# Patient Record
Sex: Male | Born: 2000 | Race: White | Hispanic: No | Marital: Single | Smoking: Never smoker
Health system: Southern US, Community
[De-identification: ages and names within clinical notes are randomized; demographics above are authoritative.]

## PROBLEM LIST (undated history)

## (undated) DIAGNOSIS — T7840XA Allergy, unspecified, initial encounter: Secondary | ICD-10-CM

## (undated) HISTORY — DX: Allergy, unspecified, initial encounter: T78.40XA

---
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
## 2022-01-14 ENCOUNTER — Telehealth: Payer: Self-pay | Admitting: Neurology

## 2022-01-14 ENCOUNTER — Encounter: Payer: Self-pay | Admitting: Neurology

## 2022-01-14 ENCOUNTER — Ambulatory Visit: Payer: Managed Care, Other (non HMO) | Admitting: Neurology

## 2022-01-14 VITALS — BP 140/70 | HR 77 | Ht 73.0 in | Wt 279.6 lb

## 2022-01-14 DIAGNOSIS — G40909 Epilepsy, unspecified, not intractable, without status epilepticus: Secondary | ICD-10-CM

## 2022-01-14 DIAGNOSIS — Z5181 Encounter for therapeutic drug level monitoring: Secondary | ICD-10-CM | POA: Diagnosis not present

## 2022-01-14 MED ORDER — LEVETIRACETAM 500 MG PO TABS: 500.0000 mg | ORAL_TABLET | Freq: Two times a day (BID) | ORAL | 4 refills | Status: DC

## 2022-01-14 NOTE — Progress Notes (Signed)
? ?[facility] ? ?PATIENT: Dalton Stark ?DOB: 05/29/2001 ? ?REQUESTING CLINICIAN: [facility] ?HISTORY FROM: Patient and both parents  ?REASON FOR VISIT: New onset seizure ? ? ?HISTORICAL ? ?CHIEF COMPLAINT:  ?Chief Complaint  ?Patient presents with  ? Hospitalization Follow-up  ?  Rm 17. Accompanied by mother and father. ?NP ED referral for seizures.  ? ? ?HISTORY OF PRESENT ILLNESS:  ?This is a 21 year old gentleman with past reported medical history of ADD who is presenting after his first lifetime seizure on April 29.  Patient states that on that day he reported going to do his SAT pretest, come home was on the couch eating and next and that he remembers is paramedics on top of him.  Then the next thing is sitting on the chair and the next thing is waking up in the hospital.  Per father who witnessed the event, patient was sitting on the couch eating then suddenly he had his eyes and head rolled back and initially he is was very stiff, then he helped him to the floor and he was unresponsive for about a minute or 2.  There was tongue biting but no urinary incontinence.  By the time EMS arrived seizure ended but patient was very confused and combative requiring Versed.  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  He was also started on Keppra 500 mg twice daily.  Since being on Keppra, he denies any seizure or seizure-like activity, report compliance with the medication, denies any side effects.  No previous seizure in the past and no report of seizure risk factors ?Mother also reported the patient was up all night studying for his SAT test. ? ? ?Handedness: Right handed ? ?Onset:12/27/21 ? ?Seizure Type: Generalized convulsion  ? ?Current frequency: Only once  ? ?Any injuries from seizures: Tongue biting  ? ?Seizure risk factors: None reported  ? ?Previous ASMs:  None ? ?Currenty ASMs: Levetiracetam 500 mg twice daily ? ?ASMs side effects: Denies ? 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 ?Previous EEGs: None ? ? ?OTHER MEDICAL CONDITIONS: ADD ? ?REVIEW OF SYSTEMS: Full 14 system review of systems performed and negative with exception of: as noted in the HPI. ? ?ALLERGIES: ?No Known Allergies ? ?HOME MEDICATIONS: ?Outpatient Medications Prior to Visit  ?Medication Sig Dispense Refill  ? levETIRAcetam (KEPPRA) 500 MG tablet Take 1 tablet (500 mg total) by mouth 2 (two) times daily. 60 tablet 0  ? ?No facility-administered medications prior to visit.  ? ? ?PAST MEDICAL HISTORY: ?Past Medical History:  ?Diagnosis Date  ? Allergy   ? ? ?PAST SURGICAL HISTORY: ?History reviewed. No pertinent surgical history. ? ?FAMILY HISTORY: ?Family History  ?Problem Relation Age of Onset  ? Diabetes Father   ? Hyperlipidemia Father   ? Asthma Maternal Grandmother   ? Hyperlipidemia Maternal Grandmother   ? ? ?SOCIAL HISTORY: ?Social History  ? ?Socioeconomic History  ? Marital status: Single  ?  Spouse name: Not on file  ? Number of children: Not on file  ? Years of education: Not on file  ? Highest education level: Not on file  ?Occupational History  ? Not on file  ?Tobacco Use  ? Smoking status: Never  ? Smokeless tobacco: Never  ?Substance and Sexual Activity  ? Alcohol use: No  ? Drug use: No  ? Sexual activity: Not on file  ?  Other Topics Concern  ? Not on file  ?Social History Narrative  ? Not on file  ? ?Social Determinants of Health  ? ?Financial Resource Strain: Not on file  ?Food Insecurity: Not on file  ?Transportation Needs: Not on file  ?Physical Activity: Not on file  ?Stress: Not on file  ?Social Connections: Not on file  ?Intimate Partner Violence: Not on file  ? ? ?PHYSICAL EXAM ? ?GENERAL EXAM/CONSTITUTIONAL: ?Vitals:  ?Vitals:  ? 01/14/22 1148  ?BP: 140/70  ?Pulse: 77  ?Weight: 279 lb 9.6 oz (126.8 kg)  ?Height: 6\' 1"  (1.854 m)  ? ?Body mass index is 36.89  kg/m?. ?Wt Readings from Last 3 Encounters:  ?01/14/22 279 lb 9.6 oz (126.8 kg) (>99 %, Z= 2.82)*  ?12/27/21 250 lb (113.4 kg) (>99 %, Z= 2.44)*  ?12/27/18 210 lb (95.3 kg) (99 %, Z= 2.31)*  ? ?* Growth percentiles are based on CDC (Boys, 2-20 Years) data.  ? ?Patient is in no distress; well developed, nourished and groomed; neck is supple ? ?EYES: ?Pupils round and reactive to light, Visual fields full to confrontation, Extraocular movements intacts,  ?No results found. ? ?MUSCULOSKELETAL: ?Gait, strength, tone, movements noted in Neurologic exam below ? ?NEUROLOGIC: ?MENTAL STATUS:  ?   ? View : No data to display.  ?  ?  ?  ? ?awake, alert, oriented to person, place and time ?recent and remote memory intact ?normal attention and concentration ?language fluent, comprehension intact, naming intact ?fund of knowledge appropriate ? ?CRANIAL NERVE:  ?2nd, 3rd, 4th, 6th - pupils equal and reactive to light, visual fields full to confrontation, extraocular muscles intact, no nystagmus ?5th - facial sensation symmetric ?7th - facial strength symmetric ?8th - hearing intact ?9th - palate elevates symmetrically, uvula midline ?11th - shoulder shrug symmetric ?12th - tongue protrusion midline ? ?MOTOR:  ?normal bulk and tone, full strength in the BUE, BLE ? ?SENSORY:  ?normal and symmetric to light touch, pinprick, temperature, vibration ? ?COORDINATION:  ?finger-nose-finger, fine finger movements normal ? ?REFLEXES:  ?deep tendon reflexes present and symmetric ? ?GAIT/STATION:  ?normal ? ? ?DIAGNOSTIC DATA (LABS, IMAGING, TESTING) ?- I reviewed patient records, labs, notes, testing and imaging myself where available. ? ?Lab Results  ?Component Value Date  ? WBC 10.7 (H) 12/27/2021  ? HGB 16.8 12/27/2021  ? HCT 49.5 12/27/2021  ? MCV 85.5 12/27/2021  ? PLT 306 12/27/2021  ? ?   ?Component Value Date/Time  ? NA 139 12/27/2021 1545  ? K 4.0 12/27/2021 1545  ? CL 105 12/27/2021 1545  ? CO2 20 (L) 12/27/2021 1545  ? GLUCOSE 131  (H) 12/27/2021 1545  ? BUN 9 12/27/2021 1545  ? CREATININE 1.05 12/27/2021 1545  ? CALCIUM 9.9 12/27/2021 1545  ? GFRNONAA >60 12/27/2021 1545  ? ?No results found for: CHOL, HDL, LDLCALC, LDLDIRECT, TRIG ?No results found for: HGBA1C ?No results found for: VITAMINB12 ?No results found for: TSH ? ? ?MRI Brain without contrast 12/27/2021 ?1. 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 ?2. No acute intracranial process ? ?I personally reviewed brain Images. ? ? ?ASSESSMENT AND PLAN ? ?21 y.o. year old male  with ADD who is presenting after his first lifetime seizure in the setting of sleep deprivation.  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  I will start  by obtaining a MRI brain with and without contrast for better assessment of this finding, obtain a routine EEG and Keppra level.  I will continue the patient on Keppra 500 mg twice daily and adjust as needed based on the Keppra level.  I informed parents that this is his first lifetime seizure and depending on what the EEG and MRI show will determine how long he will be on the medication.  They are comfortable with plans, all other questions answered.  I will contact him to go over the result of the MRI and EEG and I will see him in 3 months for follow-up. ? ? ?1. Seizure disorder (HCC)   ?2. Therapeutic drug monitoring   ? ? 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 ? ?Per [facility] statutes, patients with seizures are not allowed to drive until they have been seizure-free for six months.  Other recommendations include using caution when using heavy equipment  or power tools. Avoid working on ladders or at heights. Take showers instead of baths.  Do not swim alone.  Ensure the water temperature is not too high on the home water heater. Do not go swimming alone. Do not lock your

## 2022-01-14 NOTE — Telephone Encounter (Signed)
[employer] sent to GI they obtain auth and call patient to schedule 

## 2022-01-14 NOTE — Patient Instructions (Addendum)
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

## 2022-01-15 LAB — LEVETIRACETAM LEVEL: Levetiracetam Lvl: 10.5 ug/mL (ref 10.0–40.0)

## 2022-01-22 ENCOUNTER — Telehealth: Payer: Self-pay | Admitting: Neurology

## 2022-01-22 ENCOUNTER — Ambulatory Visit: Payer: Managed Care, Other (non HMO) | Admitting: Neurology

## 2022-01-22 ENCOUNTER — Telehealth: Payer: Self-pay | Admitting: *Deleted

## 2022-01-22 DIAGNOSIS — G40909 Epilepsy, unspecified, not intractable, without status epilepticus: Secondary | ICD-10-CM

## 2022-01-22 NOTE — Telephone Encounter (Signed)
I spoke to his mother and provided the EEG results.

## 2022-01-22 NOTE — Telephone Encounter (Signed)
Thanks. Noted.

## 2022-01-22 NOTE — Telephone Encounter (Signed)
For EEG and MRI results please call patients mother Babette Relic [phone]

## 2022-01-22 NOTE — Telephone Encounter (Signed)
-----   Message from Windell Norfolk, MD sent at 01/22/2022  4:22 PM EDT ----- Please call patients mother Babette Relic [phone] and inform her that [facility] EEG (Brain wave test) was normal. In particular, there were no epileptiform discharges and no seizures. No further action is required on this test at this time. Please keep any upcoming appointments or tests and  call us with any interim questions, concerns, problems or updates. Thanks,   Windell Norfolk, MD

## 2022-01-22 NOTE — Procedures (Signed)
    History:  21 year old man with seizure  EEG classification: Awake and drowsy  Description of the recording: The background rhythms of this recording consists of a fairly well modulated medium amplitude alpha rhythm of 9 Hz that is reactive to eye opening and closure. As the record progresses, the patient appears to remain in the waking state throughout the recording. Photic stimulation was performed, did not show any abnormalities. Hyperventilation was also performed, did not show any abnormalities. Toward the end of the recording, the patient enters the drowsy state with slight symmetric slowing seen. The patient never enters stage II sleep. No abnormal epileptiform discharges seen during this recording. There was no focal slowing. EKG monitor shows no evidence of cardiac rhythm abnormalities with a heart rate of 72.  Abnormality: None   Impression: This is a normal EEG recording in the waking and drowsy state. No evidence of interictal epileptiform discharges seen. A normal EEG does not exclude a diagnosis of epilepsy.    Windell Norfolk, MD [facility]

## 2022-01-25 ENCOUNTER — Ambulatory Visit
Admission: RE | Admit: 2022-01-25 | Discharge: 2022-01-25 | Disposition: A | Discharge status: Home / Self Care | Payer: Managed Care, Other (non HMO) | Source: Ambulatory Visit | Attending: Neurology | Admitting: Neurology

## 2022-01-25 DIAGNOSIS — G40909 Epilepsy, unspecified, not intractable, without status epilepticus: Secondary | ICD-10-CM

## 2022-01-25 MED ORDER — GADOBENATE DIMEGLUMINE 529 MG/ML IV SOLN
20.0000 mL | Freq: Once | INTRAVENOUS | Status: AC | PRN
  Administered 2022-01-25: 20 mL via INTRAVENOUS

## 2022-01-29 ENCOUNTER — Encounter: Payer: Self-pay | Admitting: Neurology

## 2022-04-16 ENCOUNTER — Ambulatory Visit: Payer: Managed Care, Other (non HMO) | Admitting: Neurology

## 2022-04-16 ENCOUNTER — Encounter: Payer: Self-pay | Admitting: Neurology

## 2022-04-16 VITALS — BP 128/80 | HR 80 | Ht 73.0 in | Wt 280.0 lb

## 2022-04-16 DIAGNOSIS — G40909 Epilepsy, unspecified, not intractable, without status epilepticus: Secondary | ICD-10-CM

## 2022-04-16 NOTE — Progress Notes (Addendum)
[facility]  PATIENT: Dalton Stark DOB: March 28, 2001  REQUESTING CLINICIAN: No ref. provider found HISTORY FROM: Patient and both parents  REASON FOR VISIT: New onset seizure   HISTORICAL  CHIEF COMPLAINT:  Chief Complaint  Patient presents with   Follow-up    Rm 13, with parents  States he is doing well, reports no sz   INTERVAL HISTORY 04/16/2022:  Patient presented today for follow-up with parents.  Since last visit denies any seizure or seizure-like activity.  He is compliant with the Keppra 500 mg twice daily and denies any side effect from the medication.  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  No other complaint or concerns.   HISTORY OF PRESENT ILLNESS:  This is a 21 year old gentleman with past reported medical history of ADD who is presenting after his first lifetime seizure on April 29.  Patient states that on that day he reported going to do his SAT pretest, come home was on the couch eating and next and that he remembers is paramedics on top of him.  Then the next thing is sitting on the chair and the next thing is waking up in the hospital.  Per father who witnessed the event, patient was sitting on the couch eating then suddenly he had his eyes and head rolled back and initially he is was very stiff, then he helped him to the floor and he was unresponsive for about a minute or 2.  There was tongue biting but no urinary incontinence.  By the time EMS arrived seizure ended but patient was very confused and combative requiring Versed.  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  He was also started on Keppra 500 mg twice daily.  Since being on Keppra, he denies any seizure or seizure-like activity, report compliance with the medication, denies any side effects.  No previous seizure in the past and no report of seizure  risk factors Mother also reported the patient was up all night studying for his SAT test.   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  ALLERGIES: No Known Allergies  HOME MEDICATIONS: Outpatient Medications Prior to Visit  Medication Sig Dispense Refill   levETIRAcetam (KEPPRA) 500 MG tablet Take 1 tablet (500 mg total) by mouth 2 (two) times daily. 180 tablet 4   No facility-administered medications prior to visit.    PAST MEDICAL HISTORY: Past Medical History:  Diagnosis Date   Allergy     PAST SURGICAL HISTORY: History reviewed. No pertinent surgical history.  FAMILY HISTORY: Family History  Problem Relation Age of Onset   Diabetes Father    Hyperlipidemia Father    Asthma Maternal Grandmother    Hyperlipidemia Maternal Grandmother     SOCIAL HISTORY: Social History   Socioeconomic History   Marital status: Single    Spouse name: Not on file   Number of children: Not on file  Years of education: Not on file   Highest education level: Not on file  Occupational History   Not on file  Tobacco Use   Smoking status: Never   Smokeless tobacco: Never  Substance and Sexual Activity   Alcohol use: No   Drug use: No   Sexual activity: Not on file  Other Topics Concern   Not on file  Social History Narrative   Not on file   Social Determinants of Health   Financial Resource Strain: Not on file  Food Insecurity: Not on file  Transportation Needs: Not on file  Physical Activity: Not  on file  Stress: Not on file  Social Connections: Not on file  Intimate Partner Violence: Not on file    PHYSICAL EXAM  GENERAL EXAM/CONSTITUTIONAL: Vitals:  Vitals:   04/16/22 1156  BP: 128/80  Pulse: 80  Weight: 280 lb (127 kg)  Height: 6\' 1"  (1.854 m)   Body mass index is 36.94 kg/m. Wt Readings from Last 3 Encounters:  04/16/22 280 lb (127 kg) (>99%, Z= 2.82)*  01/14/22 279 lb 9.6 oz (126.8 kg) (>99%, Z= 2.82)*  12/27/21 250 lb (113.4 kg) (>99%, Z= 2.44)*   * Growth percentiles are based on CDC (Boys, 2-20 Years) data.   Patient is in no distress; well developed, nourished and groomed; neck is supple  EYES: Pupils round and reactive to light, Visual fields full to confrontation, Extraocular movements intacts,  No results found.  MUSCULOSKELETAL: Gait, strength, tone, movements noted in Neurologic exam below  NEUROLOGIC: MENTAL STATUS:      No data to display         awake, alert, oriented to person, place and time recent and remote memory intact normal attention and concentration language fluent, comprehension intact, naming intact fund of knowledge appropriate  CRANIAL NERVE:  2nd, 3rd, 4th, 6th - pupils equal and reactive to light, visual fields full to confrontation, extraocular muscles intact, no nystagmus 5th - facial sensation symmetric 7th - facial strength symmetric 8th - hearing intact 9th - palate elevates symmetrically, uvula midline 11th - shoulder shrug symmetric 12th - tongue protrusion midline  MOTOR:  normal bulk and tone, full strength in the BUE, BLE  SENSORY:  normal and symmetric to light touch, pinprick, temperature, vibration  COORDINATION:  finger-nose-finger, fine finger movements normal  REFLEXES:  deep tendon reflexes present and symmetric  GAIT/STATION:  normal   DIAGNOSTIC DATA (LABS, IMAGING, TESTING) - I reviewed patient records, labs, notes, testing and imaging myself where available.  Lab Results   Component Value Date   WBC 10.7 (H) 12/27/2021   HGB 16.8 12/27/2021   HCT 49.5 12/27/2021   MCV 85.5 12/27/2021   PLT 306 12/27/2021      Component Value Date/Time   NA 139 12/27/2021 1545   K 4.0 12/27/2021 1545   CL 105 12/27/2021 1545   CO2 20 (L) 12/27/2021 1545   GLUCOSE 131 (H) 12/27/2021 1545   BUN 9 12/27/2021 1545   CREATININE 1.05 12/27/2021 1545   CALCIUM 9.9 12/27/2021 1545   GFRNONAA >60 12/27/2021 1545   No results found for: "CHOL", "HDL", "LDLCALC", "LDLDIRECT", "TRIG" No results found for: "HGBA1C" No results found for: "VITAMINB12" No results found for: "TSH"   MRI Brain without contrast 12/27/2021 1. 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 2. No acute intracranial process  MRI Brain with and without contrast 01/25/2022 1.There is  a 3 mm focus with signal intensity equal to gray matter on all sequences in the genu of the corpus callosum anterior to the frontal horn of the left lateral ventricle.  It does not enhance.  It could represent a gray matter heterotopia. 2. 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 Normal enhancement pattern.  No acute findings.   I personally reviewed brain Images.   ASSESSMENT AND PLAN  21 y.o. year old male  with ADD who is presenting for follow up after his first lifetime seizure in the setting of sleep deprivation in April.  He is doing well on Keppra 500 mg twice daily, denies any additional seizures.  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  This heterotropia is at least as likely as not contributing to the patient's seizure but cannot be absolute.  I will proceed with an ambulatory EEG for further evaluation.  Patient will be going to college but he will contact us when he gets a break from school and at that time, we will complete the EEG.  The rationale behind the  EEG is to capture abnormal discharges in the left frontal region.  I will see him in 1 year for follow-up; if he continues to do better at that time we will most likely discontinue the Keppra.  Patient and family are comfortable with plans.   1. Seizure disorder [facility])     Patient Instructions  Continue with Keppra 500 mg twice daily Patient to contact us when he is ready for ambulatory EEG Follow-up in a year or sooner if worse.   Per [facility] statutes, patients with seizures are not allowed to drive until they have been seizure-free for six months.  Other recommendations include using caution when using heavy equipment or power tools. Avoid working on ladders or at heights. Take showers instead of baths.  Do not swim alone.  Ensure the water temperature is not too high on the home water heater. Do not go swimming alone. Do not lock yourself in a room alone (i.e. bathroom). When caring for infants or small children, sit down when holding, feeding, or changing them to minimize risk of injury to the child in the event you have a seizure. Maintain good sleep hygiene. Avoid alcohol.  Also recommend adequate sleep, hydration, good diet and minimize stress.   During the Seizure  - First, ensure adequate ventilation and place patients on the floor on their left side  Loosen clothing around the neck and ensure the airway is patent. If the patient is clenching the teeth, do not force the mouth open with any object as this can cause severe damage - Remove all items from the surrounding that can be hazardous. The patient may be oblivious to what's happening and may not even know what he or she is doing. If the patient is confused and wandering, either gently guide him/her away and block access to outside areas - Reassure the individual and be comforting - Call 911. In most cases, the seizure ends before EMS arrives. However, there are cases when seizures may last over 3 to 5 minutes. Or the  individual may have developed breathing difficulties or severe injuries. If a pregnant patient or a person with diabetes develops a seizure, it is prudent to call an ambulance. - Finally, if the patient does not regain full consciousness, then call EMS. Most patients will remain confused for about 45 to 90 minutes  after a seizure, so you must use judgment in calling for help. - Avoid restraints but make sure the patient is in a bed with padded side rails - Place the individual in a lateral position with the neck slightly flexed; this will help the saliva drain from the mouth and prevent the tongue from falling backward - Remove all nearby furniture and other hazards from the area - Provide verbal assurance as the individual is regaining consciousness - Provide the patient with privacy if possible - Call for help and start treatment as ordered by the caregiver   After the Seizure (Postictal Stage)  After a seizure, most patients experience confusion, fatigue, muscle pain and/or a headache. Thus, one should permit the individual to sleep. For the next few days, reassurance is essential. Being calm and helping reorient the person is also of importance.  Most seizures are painless and end spontaneously. Seizures are not harmful to others but can lead to complications such as stress on the lungs, brain and the heart. Individuals with prior lung problems may develop labored breathing and respiratory distress.     No orders of the defined types were placed in this encounter.   No orders of the defined types were placed in this encounter.   Return in about 1 year (around 04/17/2023).   I have spent a total of 40 minutes dedicated to this patient today, preparing to see patient, performing a medically appropriate examination and evaluation, ordering tests and/or medications and procedures, and counseling and educating the patient/family/caregiver; independently interpreting result and communicating  results to the family/patient/caregiver; and documenting clinical information in the electronic medical record.    Windell Norfolk, MD 05/11/2023, 7:59 AM  [facility] [address], [address] [place], [place] [address] [phone]

## 2022-04-16 NOTE — Patient Instructions (Addendum)
Continue with Keppra 500 mg twice daily Patient to contact us when he is ready for ambulatory EEG Follow-up in a year or sooner if worse.

## 2022-08-22 ENCOUNTER — Other Ambulatory Visit: Payer: Self-pay | Admitting: Neurology

## 2022-08-22 ENCOUNTER — Encounter: Payer: Self-pay | Admitting: Neurology

## 2022-08-22 DIAGNOSIS — G40909 Epilepsy, unspecified, not intractable, without status epilepticus: Secondary | ICD-10-CM

## 2022-08-22 NOTE — Progress Notes (Signed)
Please call patient/mother and inform them that the recent ambulatory EEG (Brain wave test) was abnormal. It showed generalized epileptiform discharges seen during sleep. This is consistent with generalized epilepsy. We did not see any abnormality in the left frontal region where the probable abnormality is located. Please advised them to continue with Keppra 500 mg twice daily for now. Follow up as scheduled.  Thanks,   Windell Norfolk, MD

## 2022-08-22 NOTE — Procedures (Signed)
Clinical History : This is a 21y/o M who presents with a single seizure on April 29 that consisted of eyes rolling back, becoming stiff followed by generalized convulsions and tongue biting, but no incontinence lasting a minute or two. Per mom he had been up all night studying for his SAT's. He has a past history of ADD. 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  INTERMITTENT MONITORING with VIDEO TECHNICAL SUMMARY: This AVEEG was performed using equipment provided by [employer] utilizing Bluetooth ( [employer] ) amplifiers with continuous EEGT attended video collection using encrypted remote transmission via [employer] secured cellular tower network with data rates for each AVEEG performed. This is a Therapist, music AVEEG, obtained, according to the 10-20 international electrode placement system, reformatted digitally into referential and bipolar montages. Data was acquired with a minimum of 21 bipolar connections and sampled at a minimum rate of 250 cycles per second per channel, maximum rate of 450 cycles per second per channel and two channels for EKG. The entire VEEG study was recorded through cable and or radio telemetry for subsequent analysis. Specified epochs of the AVEEG data were identified at the direction of the subject by the depression of a push button by the patient. Each patients event file included data acquired two minutes prior to the push button activation and continuing until two minutes afterwards. AVEEG files were reviewed on [employer] [facility] server, Licensed Software provided by [employer] with a digital high frequency filter set at 70 Hz and a low frequency filter set at 1 Hz with a paper speed of 22mm/s resulting in 10 seconds per digital page. This entire AVEEG was reviewed by the EEG Technologist. Random time samples, random sleep samples, clips, patient initiated push button files with included patient daily diary logs, EEG Technologist pruned data was reviewed  and verified for accuracy and validity by the governing reading neurologist in full details. This AEEGV was fully compliant with all requirements for CPT 97500 for setup, patient education, take down and administered by an EEG technologist. Long-Term EEG with Video was monitored intermittently by a qualified EEG technologist for the entirety of the recording; quality check-ins were performed at a minimum of every two hours, checking and documenting real-time data and video to assure the integrity and quality of the recording (e.g., camera position, electrode integrity and impedance), and identify the need for maintenance. For intermittent monitoring, an EEG Technologist monitored no more than 12 patients concurrently. Diagnostic video was captured at least 80% of the time during the recording.  PATIENT EVENTS: There were no patient events noted or captured during this recording.  TECHNOLOGIST EVENTS: Occasional generalized high amplitude poly sharps and spike and wave discharges during sleep up to 5Hz   TIME SAMPLES: 10-minutes of every 2 hours recorded are reviewed as random time samples.  SLEEP SAMPLES: 5-minutes of every 24 hours recorded are reviewed as random sleep samples.  AWAKE: At maximal level of alertness, the posterior dominant background activity was continuous, reactive, low voltage rhythm of 8.5-9 Hz. This was symmetric, well-modulated, and attenuated with eye opening. Diffuse, symmetric, frontocentral beta range activity was present.  SLEEP: N1 Sleep (Stage 1) was observed and characterized by the disappearance of alpha rhythm and the appearance of vertex activity. N2 Sleep (Stage 2) was observed and characterized by vertex waves, K-complexes, and sleep spindles. N3 (Stage 3) sleep was observed and characterized by high amplitude Delta activity of 20%. REM sleep was observed.  EKG: There were no arrhythmias or abnormalities noted during this  recording.   Impression: This  is an abnormal ambulatory video EEG due to presence of generalized epileptiform discharges (sharps and spike and slow waves) up to 5Hz . 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   Alric Ran, MD [facility]

## 2022-08-27 ENCOUNTER — Telehealth: Payer: Self-pay

## 2022-08-27 NOTE — Telephone Encounter (Signed)
Spoke to mother, results given, mother had several questions about EEG results and asked for call back from MD when return to office.

## 2022-08-27 NOTE — Telephone Encounter (Signed)
-----   Message from Windell Norfolk, MD sent at 08/22/2022  9:48 AM EST ----- Please call patient/mother and inform them that the recent ambulatory EEG (Brain wave test) was abnormal. It showed generalized epileptiform discharges seen during sleep. This is consistent with generalized epilepsy. We did not see any abnormality in the left frontal region where the probable abnormality is located. Please advised them to continue with Keppra 500 mg twice daily for now. Follow up as scheduled.  Thanks,   Windell Norfolk, MD

## 2022-09-07 NOTE — Telephone Encounter (Signed)
Spoke with mother, all questions answered. He is compliant with the Keppra 500 mg twice daily but sometimes will forget to take it. Denies any breakthrough seizures.

## 2022-10-20 ENCOUNTER — Encounter: Payer: Self-pay | Admitting: Neurology

## 2022-10-21 ENCOUNTER — Other Ambulatory Visit: Payer: Self-pay | Admitting: Neurology

## 2022-10-21 MED ORDER — LEVETIRACETAM ER 1000 MG PO TB24: 1000.0000 mg | ORAL_TABLET | Freq: Every day | ORAL | 3 refills | Status: DC

## 2022-10-21 NOTE — Progress Notes (Signed)
Will switch to extended due to side effects.

## 2022-11-05 ENCOUNTER — Other Ambulatory Visit (HOSPITAL_COMMUNITY): Payer: Self-pay

## 2022-11-05 NOTE — Telephone Encounter (Signed)
Generic is fine. Thanks

## 2022-11-17 NOTE — Telephone Encounter (Signed)
It seems like Keppra XR is out of stock. We can continue with IR 500 mg twice a day until the pharmacy has the XR version.

## 2022-11-23 ENCOUNTER — Other Ambulatory Visit: Payer: Self-pay

## 2022-11-23 DIAGNOSIS — G40909 Epilepsy, unspecified, not intractable, without status epilepticus: Secondary | ICD-10-CM

## 2022-11-23 MED ORDER — LEVETIRACETAM ER 1000 MG PO TB24: 1000.0000 mg | ORAL_TABLET | Freq: Every day | ORAL | 3 refills | Status: DC

## 2022-12-03 ENCOUNTER — Telehealth: Payer: Self-pay | Admitting: Neurology

## 2022-12-03 MED ORDER — LEVETIRACETAM ER 500 MG PO TB24: 1000.0000 mg | ORAL_TABLET | Freq: Every day | ORAL | 0 refills | Status: DC

## 2022-12-03 NOTE — Telephone Encounter (Signed)
Rx sent to pharmacy   

## 2022-12-03 NOTE — Telephone Encounter (Signed)
Pt mother called. Stated the pharmacy is out of stock levETIRAcetam 1,000 mg. She is asking if levETIRAcetam  500mg  for a 30day supply can be called in [employer]. She is requesting a call back

## 2022-12-05 ENCOUNTER — Other Ambulatory Visit: Payer: Self-pay | Admitting: Neurology

## 2022-12-05 MED ORDER — LEVETIRACETAM 500 MG PO TABS: 500.0000 mg | ORAL_TABLET | Freq: Two times a day (BID) | ORAL | 2 refills | Status: DC

## 2022-12-05 NOTE — Telephone Encounter (Signed)
I sent Keppra IR 500 mg, if unable to get XR formulation

## 2022-12-07 NOTE — Telephone Encounter (Signed)
ER is extended release and the 2 X 500 mg metabolize the same as 1000 mg tablets.

## 2022-12-07 NOTE — Telephone Encounter (Signed)
Pt mother called, requesting a call back from nurse to discuss how pt should take  levETIRAcetam  500mg  extended release.

## 2022-12-10 ENCOUNTER — Other Ambulatory Visit: Payer: Self-pay

## 2022-12-10 MED ORDER — LEVETIRACETAM 500 MG PO TABS: 500.0000 mg | ORAL_TABLET | Freq: Two times a day (BID) | ORAL | 2 refills | Status: DC

## 2022-12-10 NOTE — Telephone Encounter (Signed)
Thanks

## 2022-12-10 NOTE — Telephone Encounter (Signed)
Do they have Extended released in stock. The last time we check, they did not have any extended release, reason for immediate release.

## 2022-12-14 ENCOUNTER — Other Ambulatory Visit: Payer: Self-pay

## 2022-12-14 MED ORDER — LEVETIRACETAM ER 500 MG PO TB24: 500.0000 mg | ORAL_TABLET | Freq: Two times a day (BID) | ORAL | 0 refills | Status: DC

## 2023-03-30 ENCOUNTER — Telehealth: Payer: Self-pay | Admitting: Neurology

## 2023-03-30 NOTE — Telephone Encounter (Signed)
..   Pt understands that although there may be some limitations with this type of visit, we will take all precautions to reduce any security or privacy concerns.  Pt understands that this will be treated like an in office visit and we will file with pt's insurance, and there may be a patient responsible charge related to this service. ? ?

## 2023-04-01 ENCOUNTER — Ambulatory Visit: Payer: Managed Care, Other (non HMO) | Admitting: Neurology

## 2023-05-10 ENCOUNTER — Other Ambulatory Visit: Payer: Self-pay | Admitting: Neurology

## 2023-05-11 ENCOUNTER — Other Ambulatory Visit: Payer: Self-pay | Admitting: Neurology

## 2023-06-21 ENCOUNTER — Encounter: Payer: Self-pay | Admitting: Neurology

## 2023-06-21 ENCOUNTER — Telehealth (INDEPENDENT_AMBULATORY_CARE_PROVIDER_SITE_OTHER): Payer: Managed Care, Other (non HMO) | Admitting: Neurology

## 2023-06-21 DIAGNOSIS — G40B09 Juvenile myoclonic epilepsy, not intractable, without status epilepticus: Secondary | ICD-10-CM

## 2023-06-21 DIAGNOSIS — G40309 Generalized idiopathic epilepsy and epileptic syndromes, not intractable, without status epilepticus: Secondary | ICD-10-CM

## 2023-06-21 DIAGNOSIS — Z5181 Encounter for therapeutic drug level monitoring: Secondary | ICD-10-CM | POA: Diagnosis not present

## 2023-06-21 NOTE — Progress Notes (Signed)
[facility]  PATIENT: Dalton Stark DOB: 23-Oct-2000  REQUESTING CLINICIAN: No ref. provider found HISTORY FROM: Patient and both parents  REASON FOR VISIT: New onset seizure   HISTORICAL  CHIEF COMPLAINT:  Chief Complaint  Patient presents with   Seizures    Follow up: Doing well, no seizures, no concerns. In college studying business administration    INTERVAL HISTORY 06/21/2023:  Patient was called today for video visit. He was at home and the provider was in his office.  He tells me since last visit, he has been doing well, he denies any seizure or seizure-like activity.  He is compliant with his Keppra XR 1000 mg nightly.  Denies any side effect from the medication.  Overall he is doing well, no other concerns, no other complaints.   INTERVAL HISTORY 04/16/2022:  Patient presented today for follow-up with parents.  Since last visit denies any seizure or seizure-like activity.  He is compliant with the Keppra 500 mg twice daily and denies any side effect from the medication.  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  No other complaint or concerns.   HISTORY OF PRESENT ILLNESS:  This is a 22 year old gentleman with past reported medical history of ADD who is presenting after his first lifetime seizure on April 29.  Patient states that on that day he reported going to do his SAT pretest, come home was on the couch eating and next and that he remembers is paramedics on top of him.  Then the next thing is sitting on the chair and the next thing is waking up in the hospital.  Per father who witnessed the event, patient was sitting on the couch eating then suddenly he had his eyes and head rolled back and initially he is was very stiff, then he helped him to the floor and he was unresponsive for about a minute or 2.  There was tongue biting but no urinary incontinence.  By the time EMS arrived seizure ended but patient was very  confused and combative requiring Versed.  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  He was also started on Keppra 500 mg twice daily.  Since being on Keppra, he denies any seizure or seizure-like activity, report compliance with the medication, denies any side effects.  No previous seizure in the past and no report of seizure risk factors Mother also reported the patient was up all night studying for his SAT test.   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  ALLERGIES: No Known Allergies  HOME MEDICATIONS: Outpatient Medications Prior to Visit  Medication Sig Dispense Refill   levETIRAcetam (KEPPRA XR) 500 MG 24 hr tablet TAKE 2 TABLETS BY MOUTH DAILY 180 tablet 3   No facility-administered medications prior to visit.    PAST MEDICAL HISTORY: Past Medical History:  Diagnosis Date   Allergy  PAST SURGICAL HISTORY: History reviewed. No pertinent surgical history.  FAMILY HISTORY: Family History  Problem Relation Age of Onset   Diabetes Father    Hyperlipidemia Father    Asthma Maternal Grandmother    Hyperlipidemia Maternal Grandmother     SOCIAL HISTORY: Social History   Socioeconomic History   Marital status: Single    Spouse name: Not on file   Number of children: Not on file   Years of education: Not on file   Highest education level: Not on file  Occupational History   Not  on file  Tobacco Use   Smoking status: Never   Smokeless tobacco: Never  Substance and Sexual Activity   Alcohol use: No   Drug use: No   Sexual activity: Not on file  Other Topics Concern   Not on file  Social History Narrative   Not on file   Social Determinants of Health   Financial Resource Strain: Not on file  Food Insecurity: Not on file  Transportation Needs: Not on file  Physical Activity: Not on file  Stress: Not on file  Social Connections: Not on file  Intimate Partner Violence: Not on file    PHYSICAL EXAM  GENERAL EXAM/CONSTITUTIONAL: Vitals:  There were no vitals filed for this visit.  There is no height or weight on file to calculate BMI. Wt Readings from Last 3 Encounters:  04/16/22 280 lb (127 kg) (>99%, Z= 2.82)*  01/14/22 279 lb 9.6 oz (126.8 kg) (>99%, Z= 2.82)*  12/27/21 250 lb (113.4 kg) (>99%, Z= 2.44)*   * Growth percentiles are based on CDC (Boys, 2-20 Years) data.   Patient is in no distress; well developed, nourished and groomed; neck is supple  MUSCULOSKELETAL: Gait, strength, tone, movements noted in Neurologic exam below  NEUROLOGIC: MENTAL STATUS:      No data to display         awake, alert, oriented to person, place and time recent and remote memory intact normal attention and concentration language fluent, comprehension intact, naming intact fund of knowledge appropriate  CRANIAL NERVE:  2nd, 3rd, 4th, 6th - visual fields full to confrontation, extraocular muscles intact, no nystagmus 5th - facial sensation symmetric 7th - facial strength symmetric 8th - hearing intact 9th - palate elevates symmetrically, uvula midline 11th - shoulder shrug symmetric 12th - tongue protrusion midline   DIAGNOSTIC DATA (LABS, IMAGING, TESTING) - I reviewed patient records, labs, notes, testing and imaging myself where available.  Lab Results  Component Value Date   WBC 10.7 (H) 12/27/2021   HGB 16.8 12/27/2021   HCT 49.5  12/27/2021   MCV 85.5 12/27/2021   PLT 306 12/27/2021      Component Value Date/Time   NA 139 12/27/2021 1545   K 4.0 12/27/2021 1545   CL 105 12/27/2021 1545   CO2 20 (L) 12/27/2021 1545   GLUCOSE 131 (H) 12/27/2021 1545   BUN 9 12/27/2021 1545   CREATININE 1.05 12/27/2021 1545   CALCIUM 9.9 12/27/2021 1545   GFRNONAA >60 12/27/2021 1545   No results found for: "CHOL", "HDL", "LDLCALC", "LDLDIRECT", "TRIG" No results found for: "HGBA1C" No results found for: "VITAMINB12" No results found for: "TSH"   MRI Brain without contrast 12/27/2021 1. 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 2. No acute intracranial process  MRI Brain with and without contrast 01/25/2022 1.There is a 3 mm focus with signal intensity  equal to gray matter on all sequences in the genu of the corpus callosum anterior to the frontal horn of the left lateral ventricle.  It does not enhance.  It could represent a gray matter heterotopia. 2. 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 Normal enhancement pattern.  No acute findings.   Ambulatory  EEG 08/22/2022 This is an abnormal ambulatory video EEG due to presence of generalized epileptiform discharges (sharps and spike and slow waves) up to 5Hz . This finding is seen in generalized epilepsy such as Juvenile Myoclonic Epilepsy  I personally reviewed brain Images.   ASSESSMENT AND PLAN  22 y.o. year old male  with ADD who is presenting for follow up after his first lifetime seizure in the setting of sleep deprivation in April.  He is doing well on Keppra XR 1000 mg nightly, denies any additional seizures.  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  Plan will be for patient to continue the Keppra XR 1000 mg nightly and to contact us for any  additional breakthrough seizure, new complaint, or concerns.  I will see him in 1 year for follow-up or sooner if worse.   1. 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 Therapeutic drug monitoring      Patient Instructions  Continue with Keppra XR 1000 mg nightly Will check Keppra level Follow-up in 1 year or sooner if worse.   Per [facility] statutes, patients with seizures are not allowed to drive until they have been seizure-free for six months.  Other recommendations include using caution when using heavy equipment or power tools. Avoid working on ladders or at heights. Take showers instead of baths.  Do not swim alone.  Ensure the water temperature is not too high on the home water heater. Do not go swimming alone. Do not lock yourself in a room alone (i.e. bathroom). When caring for infants or small children, sit down when holding, feeding, or changing them to minimize risk of injury to the child in the event you have a seizure. Maintain good sleep hygiene. Avoid alcohol.  Also recommend adequate sleep, hydration, good diet and minimize stress.   During the Seizure  - First, ensure adequate ventilation and place patients on the floor on their left side  Loosen clothing around the neck and ensure the airway is patent. If the patient is clenching the teeth, do not force the mouth open with any object as this can cause severe damage - Remove all items from the surrounding that can be hazardous. The patient may be oblivious to what's happening and may not even know what he or she is doing. If the patient is confused and wandering, either gently guide him/her away and block access to outside areas - Reassure the individual and be comforting - Call 911. In most cases, the seizure ends before EMS arrives. However, there are cases when seizures may last over 3 to 5  minutes. Or the individual may have developed breathing difficulties or severe injuries. If a pregnant patient or a person with diabetes develops a seizure, it is prudent to call an ambulance. - Finally, if the patient does not regain full consciousness, then call EMS. Most patients will remain confused for about 45 to 90 minutes after a seizure, so you  must use judgment in calling for help. - Avoid restraints but make sure the patient is in a bed with padded side rails - Place the individual in a lateral position with the neck slightly flexed; this will help the saliva drain from the mouth and prevent the tongue from falling backward - Remove all nearby furniture and other hazards from the area - Provide verbal assurance as the individual is regaining consciousness - Provide the patient with privacy if possible - Call for help and start treatment as ordered by the caregiver   After the Seizure (Postictal Stage)  After a seizure, most patients experience confusion, fatigue, muscle pain and/or a headache. Thus, one should permit the individual to sleep. For the next few days, reassurance is essential. Being calm and helping reorient the person is also of importance.  Most seizures are painless and end spontaneously. Seizures are not harmful to others but can lead to complications such as stress on the lungs, brain and the heart. Individuals with prior lung problems may develop labored breathing and respiratory distress.     Orders Placed This Encounter  Procedures   Levetiracetam level    No orders of the defined types were placed in this encounter.   Return in about 1 year (around 06/20/2024).   Windell Norfolk, MD 06/21/2023, 11:27 AM  [facility] [address] [place], [place] [address] [phone]

## 2023-06-21 NOTE — Patient Instructions (Signed)
Continue with Keppra XR 1000 mg nightly Will check Keppra level Follow-up in 1 year or sooner if worse.

## 2023-11-08 ENCOUNTER — Other Ambulatory Visit (INDEPENDENT_AMBULATORY_CARE_PROVIDER_SITE_OTHER): Payer: Self-pay

## 2023-11-08 DIAGNOSIS — G40309 Generalized idiopathic epilepsy and epileptic syndromes, not intractable, without status epilepticus: Secondary | ICD-10-CM

## 2023-11-08 DIAGNOSIS — Z5181 Encounter for therapeutic drug level monitoring: Secondary | ICD-10-CM

## 2023-11-08 DIAGNOSIS — Z0289 Encounter for other administrative examinations: Secondary | ICD-10-CM

## 2023-11-09 ENCOUNTER — Encounter: Payer: Self-pay | Admitting: Neurology

## 2023-11-09 LAB — LEVETIRACETAM LEVEL: Levetiracetam Lvl: 12.6 ug/mL (ref 10.0–40.0)

## 2024-01-09 IMAGING — DX DG CHEST 1V PORT
1 series · 1 of 1 positions shown · non-contrast
Comparison: None.

CLINICAL DATA: Seizure scalp

EXAM:
PORTABLE CHEST 1 VIEW

[chest ap]
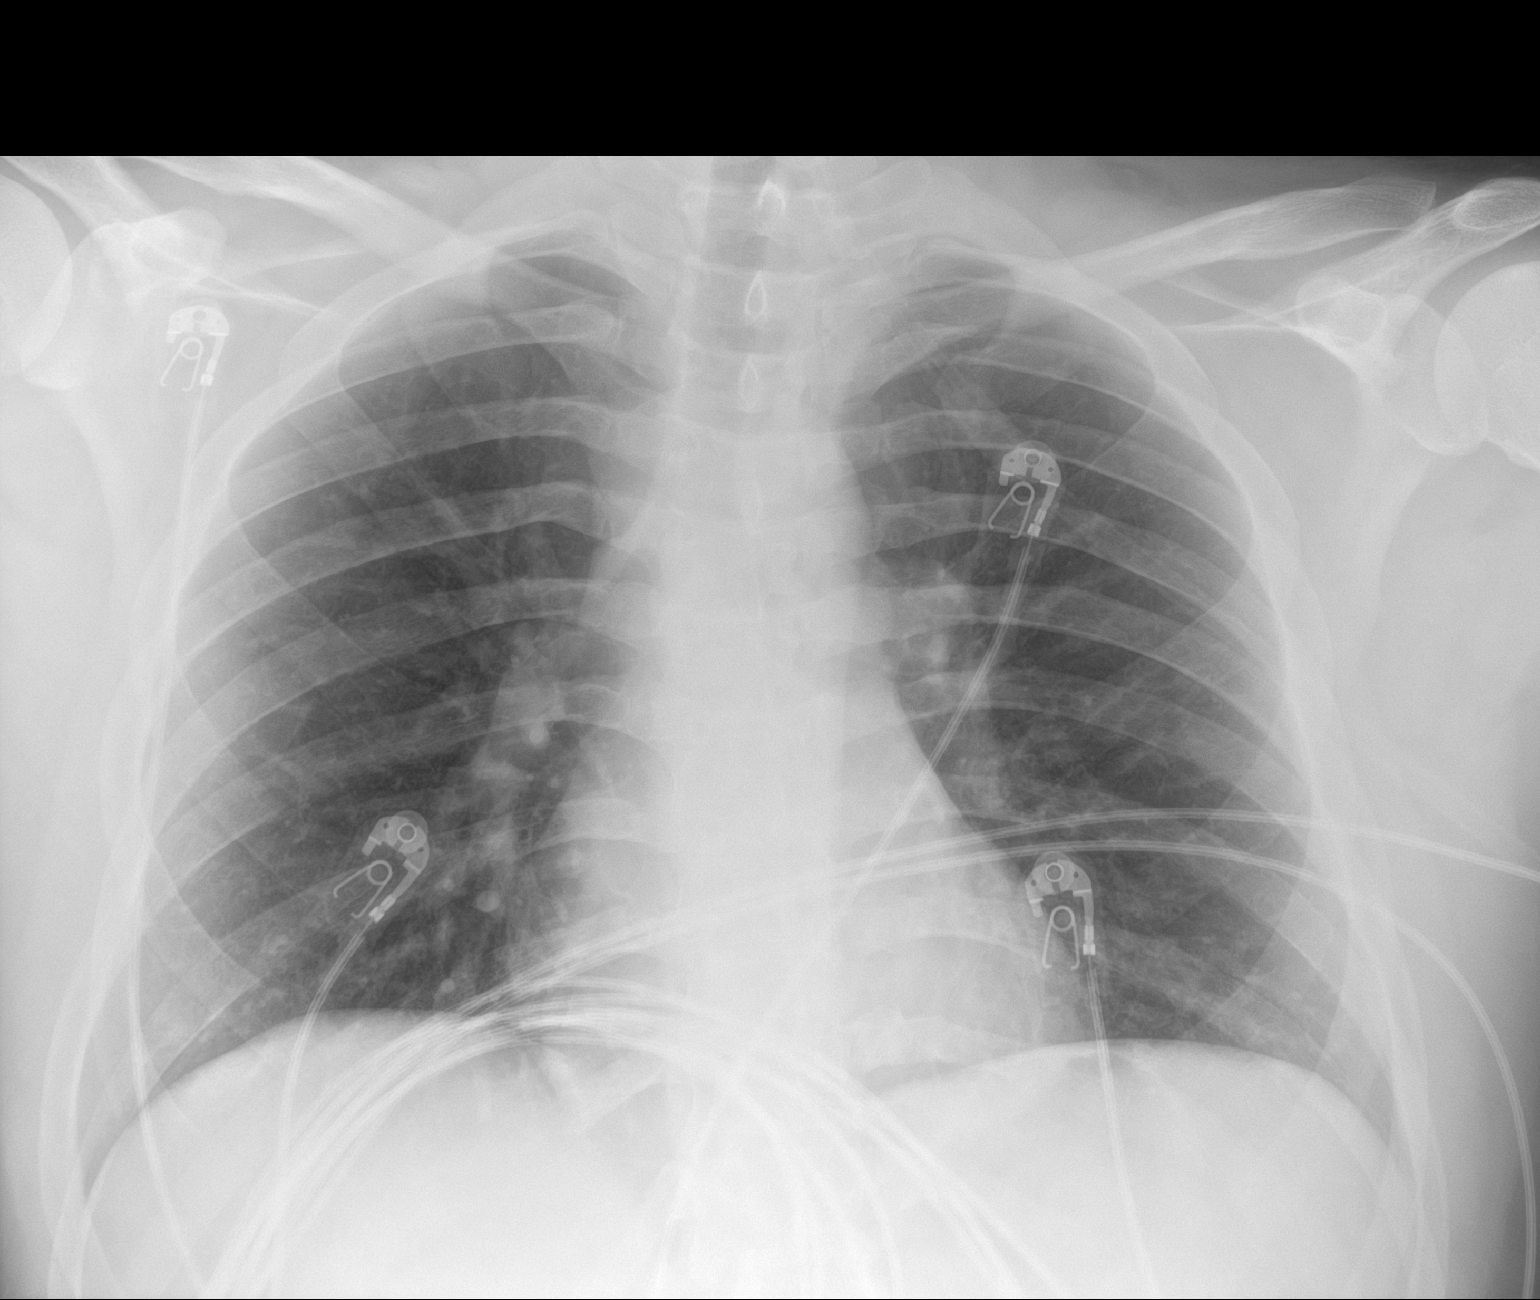

[1 of 1 positions shown; findings below may reference images not displayed]

FINDINGS: The heart size and mediastinal contours are within normal limits.
Lungs are hypoinflated but clear. No pleural effusion or edema. The
visualized skeletal structures are unremarkable.
IMPRESSION: No active disease.

## 2024-02-18 ENCOUNTER — Encounter: Payer: Self-pay | Admitting: Neurology

## 2024-02-21 MED ORDER — LEVETIRACETAM ER 500 MG PO TB24: 1000.0000 mg | ORAL_TABLET | Freq: Every day | ORAL | 3 refills | Status: DC

## 2024-06-27 NOTE — Progress Notes (Unsigned)
 PATIENT: Dalton Stark DOB: 06-20-2001  REASON FOR VISIT: follow up HISTORY FROM: patient  Virtual Visit via MyChart video  I connected with Dalton Stark on 06/28/24 at  9:30 AM EDT via MyChart video and verified that I am speaking with the correct person using two identifiers.   I discussed the limitations, risks, security and privacy concerns of performing an evaluation and management service by Mychart video and the availability of in person appointments. I also discussed with the patient that there may be a patient responsible charge related to this service. The patient expressed understanding and agreed to proceed.   History of Present Illness:  06/28/24 ALL (MyChart): Dalton Stark is a 23 y.o. male here today for seizure follow up. He was last seen by Dr Dalton Stark 06/2023 and doing well on levetiracetam  XR 1000mg  daily. Since, he continues to do well. He is tolerating ASM. No seizure activity. Rare missed doses. 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 He drives without difficulty. He is feeling well and without concerns.    History (copied from Dr Dalton Stark previous note)  INTERVAL HISTORY 06/21/2023:  Patient was called today for video visit. He was at home and the provider was in his office.  He tells me since last visit, he has been doing well, he denies any seizure or seizure-like activity.  He is compliant with his Keppra  XR 1000 mg nightly.  Denies any side effect from the medication.  Overall he is doing well, no other concerns, no other complaints.     INTERVAL HISTORY 04/16/2022:  Patient presented today for follow-up with parents.  Since last visit denies any seizure or seizure-like activity.  He is compliant with the Keppra  500 mg twice daily and denies any side effect from the medication.  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  No other complaint or concerns.     HISTORY OF PRESENT ILLNESS:  This is a  23 year old gentleman with past reported medical history of ADD who is presenting after his first lifetime seizure on April 29.  Patient states that on that day he reported going to do his SAT pretest, come home was on the couch eating and next and that he remembers is paramedics on top of him.  Then the next thing is sitting on the chair and the next thing is waking up in the hospital.  Per father who witnessed the event, patient was sitting on the couch eating then suddenly he had his eyes and head rolled back and initially he is was very stiff, then he helped him to the floor and he was unresponsive for about a minute or 2.  There was tongue biting but no urinary incontinence.  By the time EMS arrived seizure ended but patient was very confused and combative requiring Versed.  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  He was also started on Keppra  500 mg twice daily.  Since being on Keppra , he denies any seizure or seizure-like activity, report compliance with the medication, denies any side effects.  No previous seizure in the past and no report of seizure risk factors Mother also reported the patient was up all night studying for his SAT test.     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 Follows all commands speech and language fluent   Assessment and Plan:  23 y.o. year old  male  has a past medical history of Allergy. here with    ICD-10-CM   1. Seizure disorder (HCC)  G40.909 Levetiracetam  level    2. Therapeutic drug monitoring  Z51.81     3. Nonintractable juvenile myoclonic epilepsy without status epilepticus (HCC)  G40.B09       Dalton Stark is doing well. No recent seizure activity. He will continue levetiracetam  XR 1000mg  daily. I have ordered ASM level and advised he stop by the office in the next 2-4 weeks. Refills sent to pharmacy. Healthy lifestyle habits encouraged. Seizure precautions advised. He will follow up with me in 1 year.   Orders Placed This Encounter  Procedures   Levetiracetam  level    Standing Status:   Future    Expected Date:   07/28/2024    Expiration Date:   09/27/2024    Meds ordered this encounter  Medications   levETIRAcetam  (KEPPRA  XR) 500 MG 24 hr tablet    Sig: Take 2 tablets (1,000 mg total) by mouth daily.    Dispense:  180 tablet    Refill:  3    Supervising Provider:   YAN, Stark [[id number]]     Follow Up Instructions:  I discussed the assessment and treatment plan with the patient. The patient was provided an opportunity to ask questions and all were answered. The patient agreed with the plan and demonstrated an understanding of the instructions.   The patient was advised to call back or seek an in-person evaluation if the symptoms worsen or if the condition fails to improve as anticipated.  I provided 20 minutes of face-to-face and non face-to-face time during this MyChart video encounter. Patient located at their place of residence. Provider is in the office.    Dalton Vitrano, NP

## 2024-06-27 NOTE — Patient Instructions (Signed)
 Below is our plan:  We will continue levetiracetam  XR 1000mg  daily.   Please make sure you are consistent with timing of seizure medication. I recommend annual visit with primary care provider (PCP) for complete physical and routine blood work. I recommend daily intake of vitamin D (400-800iu) and calcium (800-1000mg ) for bone health. Discuss Dexa screening with PCP.   According to [place] law, you can not drive unless you are seizure / syncope free for at least 6 months and under physician's care.  Please maintain precautions. Do not participate in activities where a loss of awareness could harm you or someone else. No swimming alone, no tub bathing, no hot tubs, no driving, no operating motorized vehicles (cars, ATVs, motocycles, etc), lawnmowers, power tools or firearms. No standing at heights, such as rooftops, ladders or stairs. Avoid hot objects such as stoves, heaters, open fires. Wear a helmet when riding a bicycle, scooter, skateboard, etc. and avoid areas of traffic. Set your water heater to 120 degrees or less.  SUDEP is the sudden, unexpected death of someone with epilepsy, who was otherwise healthy. In SUDEP cases, no other cause of death is found when an autopsy is done. Each year, more than 1 in 1,000 people with epilepsy die from SUDEP. This is the leading cause of death in people with uncontrolled seizures. Until further answers are available, the best way to prevent SUDEP is to lower your risk by controlling seizures. Research has found that people with all types of epilepsy that experience convulsive seizures can be at risk.  Please make sure you are staying well hydrated. I recommend 50-60 ounces daily. Well balanced diet and regular exercise encouraged. Consistent sleep schedule with 6-8 hours recommended.   Please continue follow up with care team as directed.   Follow up with me in 1 year   You may receive a survey regarding today's visit. I encourage you to leave honest feed back  as I do use this information to improve patient care. Thank you for seeing me today!

## 2024-06-28 ENCOUNTER — Encounter: Payer: Self-pay | Admitting: Family Medicine

## 2024-06-28 ENCOUNTER — Telehealth (INDEPENDENT_AMBULATORY_CARE_PROVIDER_SITE_OTHER): Payer: Managed Care, Other (non HMO) | Admitting: Family Medicine

## 2024-06-28 DIAGNOSIS — G40B09 Juvenile myoclonic epilepsy, not intractable, without status epilepticus: Secondary | ICD-10-CM | POA: Diagnosis not present

## 2024-06-28 DIAGNOSIS — G40909 Epilepsy, unspecified, not intractable, without status epilepticus: Secondary | ICD-10-CM

## 2024-06-28 DIAGNOSIS — Z5181 Encounter for therapeutic drug level monitoring: Secondary | ICD-10-CM

## 2024-06-28 MED ORDER — LEVETIRACETAM ER 500 MG PO TB24: 1000.0000 mg | ORAL_TABLET | Freq: Every day | ORAL | 3 refills | Status: AC

## 2024-07-21 ENCOUNTER — Other Ambulatory Visit: Payer: Self-pay

## 2024-07-21 ENCOUNTER — Other Ambulatory Visit (INDEPENDENT_AMBULATORY_CARE_PROVIDER_SITE_OTHER): Payer: Self-pay

## 2024-07-21 DIAGNOSIS — G40909 Epilepsy, unspecified, not intractable, without status epilepticus: Secondary | ICD-10-CM

## 2024-07-21 DIAGNOSIS — Z0289 Encounter for other administrative examinations: Secondary | ICD-10-CM

## 2024-07-23 LAB — LEVETIRACETAM LEVEL: Levetiracetam Lvl: 10.1 ug/mL (ref 10.0–40.0)

## 2024-07-24 ENCOUNTER — Ambulatory Visit: Payer: Self-pay | Admitting: Family Medicine

## 2025-07-02 ENCOUNTER — Telehealth: Admitting: Family Medicine
